# Patient Record
Sex: Male | Born: 1980 | Race: Black or African American | Hispanic: No | Marital: Single | Smoking: Never smoker
Health system: Southern US, Community
[De-identification: ages and names within clinical notes are randomized; demographics above are authoritative.]

## PROBLEM LIST (undated history)

## (undated) HISTORY — PX: LEG SURGERY: SHX1003

---

## 2017-08-27 ENCOUNTER — Emergency Department (HOSPITAL_COMMUNITY)
Admission: EM | Admit: 2017-08-27 | Discharge: 2017-08-28 | Disposition: A | Discharge status: Home / Self Care | Payer: Self-pay | Attending: Emergency Medicine | Admitting: Emergency Medicine

## 2017-08-27 ENCOUNTER — Other Ambulatory Visit: Payer: Self-pay

## 2017-08-27 ENCOUNTER — Encounter (HOSPITAL_COMMUNITY): Payer: Self-pay | Admitting: *Deleted

## 2017-08-27 ENCOUNTER — Emergency Department (HOSPITAL_COMMUNITY): Payer: Self-pay

## 2017-08-27 DIAGNOSIS — M79605 Pain in left leg: Secondary | ICD-10-CM | POA: Insufficient documentation
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
## 2017-08-28 MED ORDER — TRAMADOL HCL 50 MG PO TABS: 50.0000 mg | ORAL_TABLET | Freq: Four times a day (QID) | ORAL | 0 refills | Status: AC | PRN

## 2017-08-28 MED ORDER — IBUPROFEN 800 MG PO TABS: 800.0000 mg | ORAL_TABLET | Freq: Three times a day (TID) | ORAL | 0 refills | Status: AC

## 2017-08-28 MED ORDER — OXYCODONE-ACETAMINOPHEN 5-325 MG PO TABS
1.0000 | ORAL_TABLET | Freq: Once | ORAL | Status: AC
  Administered 2017-08-28: 1 via ORAL
  Filled 2017-08-28: qty 1

## 2017-08-28 NOTE — Discharge Instructions (Signed)
Wear brace and take meds as prescribed. You need to follow-up with your surgeon as soon as possible. Return here for any new/acute changes.
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
## 2017-08-28 NOTE — Progress Notes (Signed)
Orthopedic Tech Progress Note Patient Details:  Curtis Barajas 10/21/1980 [id number]  Ortho Devices Type of Ortho Device: Knee Sleeve Ortho Device/Splint Location: lle Ortho Device/Splint Interventions: Ordered, Application, Adjustment   Post Interventions Patient Tolerated: Well Instructions Provided: Care of device, Adjustment of device   Trinna PostMartinez, Arisa Congleton J 08/28/2017, 1:24 AM
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
